# Patient Record
Sex: Female | Born: 1975 | Race: White | Hispanic: No | Marital: Married | Smoking: Never smoker
Health system: Southern US, Community
[De-identification: ages and names within clinical notes are randomized; demographics above are authoritative.]

## PROBLEM LIST (undated history)

## (undated) DIAGNOSIS — E039 Hypothyroidism, unspecified: Secondary | ICD-10-CM

---

## 2007-04-04 ENCOUNTER — Inpatient Hospital Stay (HOSPITAL_COMMUNITY): Admission: AD | Admit: 2007-04-04 | Discharge: 2007-04-06 | Payer: Self-pay | Admitting: *Deleted

## 2009-06-23 ENCOUNTER — Encounter: Admission: RE | Admit: 2009-06-23 | Discharge: 2009-06-23 | Payer: Self-pay | Admitting: Obstetrics

## 2010-02-16 ENCOUNTER — Encounter: Admission: RE | Admit: 2010-02-16 | Discharge: 2010-02-16 | Payer: Self-pay | Admitting: Endocrinology

## 2010-08-30 ENCOUNTER — Other Ambulatory Visit: Payer: Self-pay | Admitting: Endocrinology

## 2010-08-30 DIAGNOSIS — E041 Nontoxic single thyroid nodule: Secondary | ICD-10-CM

## 2011-01-13 LAB — CBC
HCT: 33.5 — ABNORMAL LOW
Hemoglobin: 11.7 — ABNORMAL LOW
MCHC: 34.5
MCHC: 35
MCV: 91
MCV: 92.9
Platelets: 234
Platelets: 275
RBC: 3.68 — ABNORMAL LOW
RDW: 13.4
RDW: 13.4
WBC: 12.2 — ABNORMAL HIGH

## 2011-01-13 LAB — RPR: RPR Ser Ql: NONREACTIVE

## 2011-02-20 ENCOUNTER — Ambulatory Visit
Admission: RE | Admit: 2011-02-20 | Discharge: 2011-02-20 | Disposition: A | Discharge status: Home / Self Care | Payer: BC Managed Care – PPO | Source: Ambulatory Visit | Attending: Endocrinology | Admitting: Endocrinology

## 2011-02-20 DIAGNOSIS — E041 Nontoxic single thyroid nodule: Secondary | ICD-10-CM

## 2011-04-17 ENCOUNTER — Other Ambulatory Visit: Payer: Self-pay | Admitting: Endocrinology

## 2011-04-17 DIAGNOSIS — E041 Nontoxic single thyroid nodule: Secondary | ICD-10-CM

## 2011-06-12 ENCOUNTER — Other Ambulatory Visit: Payer: BC Managed Care – PPO

## 2013-06-25 ENCOUNTER — Other Ambulatory Visit: Payer: Self-pay | Admitting: Endocrinology

## 2013-06-25 DIAGNOSIS — E041 Nontoxic single thyroid nodule: Secondary | ICD-10-CM

## 2013-07-01 ENCOUNTER — Ambulatory Visit
Admission: RE | Admit: 2013-07-01 | Discharge: 2013-07-01 | Disposition: A | Discharge status: Home / Self Care | Payer: BC Managed Care – PPO | Source: Ambulatory Visit | Attending: Endocrinology | Admitting: Endocrinology

## 2013-07-01 ENCOUNTER — Other Ambulatory Visit (HOSPITAL_COMMUNITY)
Admission: RE | Admit: 2013-07-01 | Discharge: 2013-07-01 | Disposition: A | Discharge status: Home / Self Care | Payer: BC Managed Care – PPO | Source: Ambulatory Visit | Attending: Interventional Radiology | Admitting: Interventional Radiology

## 2013-07-01 DIAGNOSIS — E041 Nontoxic single thyroid nodule: Secondary | ICD-10-CM

## 2013-11-07 ENCOUNTER — Encounter (HOSPITAL_BASED_OUTPATIENT_CLINIC_OR_DEPARTMENT_OTHER): Payer: Self-pay | Admitting: Emergency Medicine

## 2013-11-07 ENCOUNTER — Emergency Department (HOSPITAL_BASED_OUTPATIENT_CLINIC_OR_DEPARTMENT_OTHER)
Admission: EM | Admit: 2013-11-07 | Discharge: 2013-11-07 | Disposition: A | Discharge status: Home / Self Care | Payer: BC Managed Care – PPO | Attending: Emergency Medicine | Admitting: Emergency Medicine

## 2013-11-07 DIAGNOSIS — Z792 Long term (current) use of antibiotics: Secondary | ICD-10-CM | POA: Insufficient documentation

## 2013-11-07 DIAGNOSIS — H16009 Unspecified corneal ulcer, unspecified eye: Secondary | ICD-10-CM | POA: Insufficient documentation

## 2013-11-07 DIAGNOSIS — Z79899 Other long term (current) drug therapy: Secondary | ICD-10-CM | POA: Insufficient documentation

## 2013-11-07 DIAGNOSIS — H5710 Ocular pain, unspecified eye: Secondary | ICD-10-CM | POA: Insufficient documentation

## 2013-11-07 DIAGNOSIS — E039 Hypothyroidism, unspecified: Secondary | ICD-10-CM | POA: Insufficient documentation

## 2013-11-07 DIAGNOSIS — H16001 Unspecified corneal ulcer, right eye: Secondary | ICD-10-CM

## 2013-11-07 HISTORY — DX: Hypothyroidism, unspecified: E03.9

## 2013-11-07 MED ORDER — FLUORESCEIN SODIUM 1 MG OP STRP
ORAL_STRIP | OPHTHALMIC | Status: AC
  Administered 2013-11-07: 1
  Filled 2013-11-07: qty 1

## 2013-11-07 MED ORDER — ERYTHROMYCIN 5 MG/GM OP OINT: TOPICAL_OINTMENT | OPHTHALMIC | Status: AC

## 2013-11-07 MED ORDER — TETRACAINE HCL 0.5 % OP SOLN
OPHTHALMIC | Status: AC
  Administered 2013-11-07: 1 [drp]
  Filled 2013-11-07: qty 2

## 2013-11-07 NOTE — ED Provider Notes (Signed)
CSN: [id number]     Arrival date & time 11/07/13  2038 History   First MD Initiated Contact with Patient 11/07/13 2050     Chief Complaint  Patient presents with  . Eye Pain     (Consider location/radiation/quality/duration/timing/severity/associated sxs/prior Treatment) HPI Comments: Pt is a 38 y/o female who presents to the ED complaining of sudden onset right eye pain and redness beginning when she was driving earlier today. Pain described as sharp and shooting, constant. She has not tried any alleviating factors. Nothing in specific makes the pain worse. Denies vision changes. She was wearing contacts when the pain initially began, she took them out and put on her glasses. She uses 30 day contacts, she has had these in for only a few days. Denies any eye discharge. No recent rashes.  Patient is a 38 y.o. female presenting with eye pain. The history is provided by the patient.  Eye Pain    Past Medical History  Diagnosis Date  . Hypothyroidism    History reviewed. No pertinent past surgical history. No family history on file. History  Substance Use Topics  . Smoking status: Never Smoker   . Smokeless tobacco: Not on file  . Alcohol Use: Yes     Comment: occasionally   OB History   Grav Para Term Preterm Abortions TAB SAB Ect Mult Living                 Review of Systems  Eyes: Positive for pain and redness.  All other systems reviewed and are negative.     Allergies  Review of patient's allergies indicates no known allergies.  Home Medications   Prior to Admission medications   Medication Sig Start Date End Date Taking? Authorizing Provider  antiseptic oral rinse (BIOTENE) LIQD 15 mLs by Mouth Rinse route as needed for dry mouth.   Yes Historical Provider, MD  levothyroxine (SYNTHROID, LEVOTHROID) 50 MCG tablet Take 50 mcg by mouth daily before breakfast.   Yes Historical Provider, MD  Prenatal Vit-Fe Fumarate-FA (PRENATAL MULTIVITAMIN) TABS tablet Take 1 tablet  by mouth daily at 12 noon.   Yes Historical Provider, MD  Vitamin D, Ergocalciferol, (DRISDOL) 50000 UNITS CAPS capsule Take 50,000 Units by mouth every 7 (seven) days.   Yes Historical Provider, MD  erythromycin ophthalmic ointment Place a 1/2 inch ribbon of ointment into the lower eyelid every 6 hours x 5 days 11/07/13   Trevor Mace, PA-C   BP 114/69  Pulse 90  Temp(Src) 98.3 F (36.8 C) (Oral)  Resp 16  Ht 5\' 2"  (1.575 m)  Wt 130 lb (58.968 kg)  BMI 23.77 kg/m2  SpO2 100% Physical Exam  Nursing note and vitals reviewed. Constitutional: She is oriented to person, place, and time. She appears well-developed and well-nourished. No distress.  HENT:  Head: Normocephalic and atraumatic.  Mouth/Throat: Oropharynx is clear and moist.  Eyes: EOM are normal. Pupils are equal, round, and reactive to light. Right conjunctiva is injected.  Slit lamp exam:      The right eye shows corneal ulcer.  No dendritic lesions.  Neck: Normal range of motion. Neck supple.  Cardiovascular: Normal rate, regular rhythm and normal heart sounds.   Pulmonary/Chest: Effort normal and breath sounds normal.  Musculoskeletal: Normal range of motion. She exhibits no edema.  Neurological: She is alert and oriented to person, place, and time.  Skin: Skin is warm and dry. She is not diaphoretic.  Psychiatric: She has a normal mood and affect.  Her behavior is normal.    ED Course  Procedures (including critical care time) Labs Review Labs Reviewed - No data to display  Imaging Review No results found.   EKG Interpretation None      MDM   Final diagnoses:  Corneal ulcer, right   Patient presenting with right thigh pain and redness. She is nontoxic appearing and in no apparent distress. Corneal ulcer visible on general exam, however Right eye stained with fluorescein and no dendritic lesions seen. No corneal abrasion. Visual acuity 20/30 bilateral eyes. Will treat with erythromycin eye ointment. Patient  does not want any pain medication at this time. She has an ophthalmologist who she will followup with on Monday. Stable for discharge. Return precautions given. Patient states understanding of treatment care plan and is agreeable.  Trevor MaceRobyn M Albert, PA-C 11/07/13 2148

## 2013-11-07 NOTE — ED Notes (Signed)
Pt reports shooting pain and redness to right eye - denies any mechanism of injury or drainage.

## 2013-11-07 NOTE — ED Provider Notes (Signed)
Medical screening examination/treatment/procedure(s) were performed by non-physician practitioner and as supervising physician I was immediately available for consultation/collaboration.   EKG Interpretation None       Ethelda ChickMartha K Linker, MD 11/07/13 2152

## 2013-11-07 NOTE — Discharge Instructions (Signed)
Apply topical antibiotic eye drops to your right eye every 6 hours for 5 days. Follow up with your ophthalmologist Monday. Do not use contacts until followup with your ophthalmologist.  Corneal Ulcer A corneal ulcer is an open sore on the cornea. The cornea is the clear covering at the front and center of the eye.  CAUSES  Most corneal ulcers are caused by infection, but there are other causes as well.  Bacterial infection. A bacterial infection can occur and cause a corneal ulcer if:  Contact lenses are worn too long (especially overnight) or are not properly cared for.  An eye injury occurs, allowing bacteria to infect the area of injury.  Viral infection. A viral infection can occur and cause a corneal ulcer if:  The eye becomes infected with a virus, such as the herpes simplex (cold sore) virus, chickenpox virus, or shingles virus.  Fungal infection. A fungal infection can occur and cause a corneal ulcer if:  An eye injury resulted from contact with a plant or plant material.  An anti-inflammatory eye drop is overused.  You have a weakened immune system.  Contact lenses are improperly cared for or become infected.  Foreign bodies in the eye, such as sand, glass, or small pieces of glass or metal.  Dry eyes.  Certain disorders that prevent eyelids from closing completely, such as Bell's palsy.  Contact lenses, especially extended-wear soft contact lenses. Contact lenses can:  Scratch the cornea's surface, allowing bacteria to enter the scratch.  Trap dirt underneath the contact lens, which can scratch the cornea.  Harbor bacteria and fungi, making it more likely for bacterial infections to occur.  Block oxygen from the cornea, making it more likely for infections to occur. SYMPTOMS   Eye pain that is often severe.  Blurry vision.  Light sensitivity.  Pus or thick discharge coming from your eye.  Eye redness.  Feeling like something is in your eye.  Watery or  itchy eye.  Burning or stinging feeling. Some ulcers that are very big may be seen as a white spot on the cornea. DIAGNOSIS  An eye exam will be performed. Your health care provider may use a special kind of microscope (slit lamp) to look at the cornea. Eye drops may be put into the eye to make the ulcer easier to see. If it is suspected that an infection caused the corneal ulcer, tissue samples or cultures from the eye may be taken. Numbing eye drops will be given before any samples or cultures are taken. The samples or cultures will be examined in the lab to check for bacteria, viruses, or fungi. TREATMENT  Treatment of the corneal ulcer depends on the cause. If your ulcer is severe, you may be given antibiotic eye drops up until your health care provider knows the test results. Other treatments can include:  Antibacterial, antiviral, or antifungal eye drops or ointment.  Removing the foreign body that caused the eye injury.  Artificial tears or a bandage contact lens if severe dry eyes caused the corneal ulcer.  Over-the-counter or prescription pain medicine.  Steroidal eye drops if the eye is inflamed and swollen.  Antibiotic medicines by mouth.  An injection of medicine under the thin membrane covering the eyeball (conjunctiva). This allows medicine to reach the ulcer in high doses.  Eye patching to reduce irritation from blinking and bright light. An eye patch may not be given if the ulcer was caused by a bacterial infection. If the corneal ulcer causes  a scar on the cornea that interferes with vision, hospitalization and surgery may be needed to replace the cornea (corneal transplant). HOME CARE INSTRUCTIONS   If prescribed, use your antibiotic pills, eye drops, or ointment as directed. Continue using them even if you start to feel better. You may have to apply eye drops as often as every few minutes to every hour, for days. It may be necessary to set your alarm clock every few  minutes to every hour during the night. This is absolutely necessary.  Only take over-the-counter or prescription medicines as directed by your health care provider.  Apply artificial tears as needed if you have dry eyes.  Do not touch or rub your eye, because this may increase the irritation and spread the infection.  Avoid wearing eye makeup.  Stay in a dark room and use sunglasses if you have light sensitivity.  Apply cool packs to your eye to relieve discomfort and swelling.  If your eye is patched, you should not drive or use machinery. You will have reduced side vision and ability to judge distance.  Do not drive or operate machinery until approved by your health care provider. Your ability to judge distances is impaired.  Follow up with your health care provider as directed.  Do not wear contact lenses until your health care provider approves. If you normally wear contact lenses, follow these general rules to avoid the risk of a corneal ulcer:  Do not wear contact lenses while you sleep.  Wash your hands before removing contact lenses.  Properly sterilize and store your contact lenses.  Regularly clean your contact lens case.  Do not use your saliva or tap water to clean or wet your contact lenses.  Remove your contact lenses if your eye becomes irritated. You may put them back in once your eyes feel better. SEEK IMMEDIATE MEDICAL CARE IF:   You notice a change in your vision.  Your pain is getting worse, not better.  You have increasing discharge from the eye. MAKE SURE YOU:   Understand these instructions.  Will watch your condition.  Will get help right away if you are not doing well or get worse. Document Released: 05/04/2004 Document Revised: 11/27/2012 Document Reviewed: 08/27/2012 [facility] Patient Information 2015 [employer] [place]. This information is not intended to replace advice given to you by your health care provider. Make sure you discuss any  questions you have with your health care provider.

## 2013-12-12 ENCOUNTER — Other Ambulatory Visit: Payer: Self-pay | Admitting: Endocrinology

## 2013-12-12 DIAGNOSIS — E049 Nontoxic goiter, unspecified: Secondary | ICD-10-CM

## 2014-12-04 ENCOUNTER — Other Ambulatory Visit: Payer: BC Managed Care – PPO

## 2014-12-15 ENCOUNTER — Other Ambulatory Visit: Payer: BC Managed Care – PPO

## 2015-10-20 ENCOUNTER — Other Ambulatory Visit: Payer: Self-pay | Admitting: Endocrinology

## 2015-10-20 DIAGNOSIS — E041 Nontoxic single thyroid nodule: Secondary | ICD-10-CM

## 2017-04-11 ENCOUNTER — Emergency Department (HOSPITAL_BASED_OUTPATIENT_CLINIC_OR_DEPARTMENT_OTHER)
Admission: EM | Admit: 2017-04-11 | Discharge: 2017-04-11 | Disposition: A | Discharge status: Home / Self Care | Payer: 59 | Attending: Emergency Medicine | Admitting: Emergency Medicine

## 2017-04-11 ENCOUNTER — Other Ambulatory Visit: Payer: Self-pay

## 2017-04-11 ENCOUNTER — Emergency Department (HOSPITAL_BASED_OUTPATIENT_CLINIC_OR_DEPARTMENT_OTHER): Payer: 59

## 2017-04-11 ENCOUNTER — Encounter (HOSPITAL_BASED_OUTPATIENT_CLINIC_OR_DEPARTMENT_OTHER): Payer: Self-pay | Admitting: Emergency Medicine

## 2017-04-11 DIAGNOSIS — E039 Hypothyroidism, unspecified: Secondary | ICD-10-CM | POA: Diagnosis not present

## 2017-04-11 DIAGNOSIS — Z79899 Other long term (current) drug therapy: Secondary | ICD-10-CM | POA: Diagnosis not present

## 2017-04-11 DIAGNOSIS — N83209 Unspecified ovarian cyst, unspecified side: Secondary | ICD-10-CM

## 2017-04-11 DIAGNOSIS — R1033 Periumbilical pain: Secondary | ICD-10-CM | POA: Diagnosis present

## 2017-04-11 LAB — CBC
HCT: 38.1 % (ref 36.0–46.0)
Hemoglobin: 13.5 g/dL (ref 12.0–15.0)
MCH: 30.7 pg (ref 26.0–34.0)
MCHC: 35.4 g/dL (ref 30.0–36.0)
MCV: 86.6 fL (ref 78.0–100.0)
Platelets: 342 10*3/uL (ref 150–400)
RBC: 4.4 MIL/uL (ref 3.87–5.11)
RDW: 11.9 % (ref 11.5–15.5)
WBC: 17.7 10*3/uL — ABNORMAL HIGH (ref 4.0–10.5)

## 2017-04-11 LAB — RAPID HIV SCREEN (HIV 1/2 AB+AG)
HIV 1/2 Antibodies: NONREACTIVE
HIV-1 P24 Antigen - HIV24: NONREACTIVE

## 2017-04-11 LAB — URINALYSIS, MICROSCOPIC (REFLEX)

## 2017-04-11 LAB — COMPREHENSIVE METABOLIC PANEL
ALT: 9 U/L — ABNORMAL LOW (ref 14–54)
AST: 15 U/L (ref 15–41)
Albumin: 4 g/dL (ref 3.5–5.0)
Alkaline Phosphatase: 65 U/L (ref 38–126)
Anion gap: 8 (ref 5–15)
BUN: 17 mg/dL (ref 6–20)
CO2: 24 mmol/L (ref 22–32)
Calcium: 8.9 mg/dL (ref 8.9–10.3)
Chloride: 103 mmol/L (ref 101–111)
Creatinine, Ser: 0.91 mg/dL (ref 0.44–1.00)
GFR calc Af Amer: >60 mL/min (ref >60)
GFR calc non Af Amer: >60 mL/min (ref >60)
Glucose, Bld: 111 mg/dL — ABNORMAL HIGH (ref 65–99)
Potassium: 3.7 mmol/L (ref 3.5–5.1)
Sodium: 135 mmol/L (ref 135–145)
Total Bilirubin: 0.7 mg/dL (ref 0.3–1.2)
Total Protein: 7.8 g/dL (ref 6.5–8.1)

## 2017-04-11 LAB — WET PREP, GENITAL
Clue Cells Wet Prep HPF POC: NONE SEEN
SPERM: NONE SEEN
TRICH WET PREP: NONE SEEN
YEAST WET PREP: NONE SEEN

## 2017-04-11 LAB — URINALYSIS, ROUTINE W REFLEX MICROSCOPIC
Bilirubin Urine: NEGATIVE
Glucose, UA: NEGATIVE mg/dL
Ketones, ur: 15 mg/dL — AB
Leukocytes, UA: NEGATIVE
Nitrite: NEGATIVE
Protein, ur: NEGATIVE mg/dL
Specific Gravity, Urine: 1.02 (ref 1.005–1.030)
pH: 6.5 (ref 5.0–8.0)

## 2017-04-11 LAB — PREGNANCY, URINE: Preg Test, Ur: NEGATIVE

## 2017-04-11 LAB — LIPASE, BLOOD: Lipase: 30 U/L (ref 11–51)

## 2017-04-11 MED ORDER — NAPROXEN 500 MG PO TABS: 500.0000 mg | ORAL_TABLET | Freq: Two times a day (BID) | ORAL | 0 refills | Status: AC

## 2017-04-11 MED ORDER — IOPAMIDOL (ISOVUE-300) INJECTION 61%
100.0000 mL | Freq: Once | INTRAVENOUS | Status: AC | PRN
  Administered 2017-04-11: 100 mL via INTRAVENOUS

## 2017-04-11 MED ORDER — MORPHINE SULFATE (PF) 4 MG/ML IV SOLN
4.0000 mg | Freq: Once | INTRAVENOUS | Status: DC
  Filled 2017-04-11: qty 1

## 2017-04-11 MED ORDER — ONDANSETRON HCL 4 MG/2ML IJ SOLN
4.0000 mg | Freq: Once | INTRAMUSCULAR | Status: DC
  Filled 2017-04-11: qty 2

## 2017-04-11 MED ORDER — SODIUM CHLORIDE 0.9 % IV BOLUS (SEPSIS)
1000.0000 mL | Freq: Once | INTRAVENOUS | Status: AC
Start: 2017-04-11 — End: 2017-04-11
  Administered 2017-04-11: 1000 mL via INTRAVENOUS

## 2017-04-11 NOTE — ED Notes (Signed)
Called CT and she is on the list

## 2017-04-11 NOTE — ED Notes (Signed)
Attempted twice to start IV unsuccessful

## 2017-04-11 NOTE — Discharge Instructions (Signed)
Your pain is likely due a ruptured ovarian cyst.  Take naproxen as needed for pain.  Followup with your doctor for further care.

## 2017-04-11 NOTE — ED Notes (Signed)
Pt understood dc material. NAD noted. Script given at dc  

## 2017-04-11 NOTE — ED Triage Notes (Addendum)
Patient reports mid lower abdominal pain which began at 0300 this morning.  Reports nausea and 1 episode of vomiting.  Currently on menstrual cycle and reports last BM 2 days ago.  States this is normal for her. Denies dysuria.

## 2017-04-11 NOTE — ED Provider Notes (Signed)
[facility] EMERGENCY DEPARTMENT Provider Note   CSN: [id number] Arrival date & time: 04/11/17  0744     History   Chief Complaint Chief Complaint  Patient presents with  . Abdominal Pain    HPI Shelly Austin is a 42 y.o. female.  HPI   42 year old female presenting for evaluation of abdominal pain.  Patient reports she was awoke at 3 AM this morning with acute onset of periumbilical abdominal pain.  Pain is described as a sharp sensation that radiates to her right lower.  The pain was severe, with associated nausea, vomiting, diaphoresis.  Pain lasted for several hours and has since subsided.  Pain is currently 3 out of 10, worsened with movement.  She felt fine yesterday.  She does endorse chills earlier.  She is currently on her menstrual period.  No prior abdominal surgery.  She does feel mildly constipated.  No report of chest pain, shortness of breath, productive cough, dysuria, vaginal discharge.  No specific treatment tried.  Past Medical History:  Diagnosis Date  . Hypothyroidism     There are no active problems to display for this patient.   History reviewed. No pertinent surgical history.  OB History    No data available       Home Medications    Prior to Admission medications   Medication Sig Start Date End Date Taking? Authorizing Provider  antiseptic oral rinse (BIOTENE) LIQD 15 mLs by Mouth Rinse route as needed for dry mouth.    [provider]  erythromycin ophthalmic ointment Place a 1/2 inch ribbon of ointment into the lower eyelid every 6 hours x 5 days 11/07/13   Hess, Nada Boozer, PA-C  levothyroxine (SYNTHROID, LEVOTHROID) 50 MCG tablet Take 50 mcg by mouth daily before breakfast.    [provider]  Prenatal Vit-Fe Fumarate-FA (PRENATAL MULTIVITAMIN) TABS tablet Take 1 tablet by mouth daily at 12 noon.    [provider]  Vitamin D, Ergocalciferol, (DRISDOL) 50000 UNITS CAPS capsule Take 50,000 Units by mouth  every 7 (seven) days.    [provider]    Family History History reviewed. No pertinent family history.  Social History Social History   Tobacco Use  . Smoking status: Never Smoker  . Smokeless tobacco: Never Used  Substance Use Topics  . Alcohol use: Yes    Comment: occasionally  . Drug use: No     Allergies   Patient has no known allergies.   Review of Systems Review of Systems  All other systems reviewed and are negative.    Physical Exam Updated Vital Signs BP 124/73 (BP Location: Right Arm)   Pulse (!) 117   Temp 99.4 F (37.4 C) (Oral)   Resp 18   Ht 5\' 2"  (1.575 m)   Wt 72.6 kg (160 lb)   LMP 04/09/2017 (Exact Date)   SpO2 100%   BMI 29.26 kg/m    Physical Exam  Constitutional: She appears well-developed and well-nourished. No distress.  HENT:  Head: Atraumatic.  Eyes: Conjunctivae are normal.  Neck: Neck supple.  Cardiovascular: Regular rhythm.  Tachycardia without murmur rubs or gallops  Pulmonary/Chest: Effort normal and breath sounds normal.  Abdominal: Normal appearance and bowel sounds are normal. She exhibits no pulsatile midline mass. There is tenderness in the right lower quadrant and periumbilical area. There is no tenderness at McBurney's point and negative Murphy's sign. Hernia confirmed negative in the right inguinal area and confirmed negative in the left inguinal area.  Genitourinary:  Genitourinary Comments: Chaperone present during exam.  No inguinal lymphopathy or inguinal hernia noted.  Normal external genitalia.  No discomfort with speculum insertion.  Mild amount of blood noted in vaginal vault.  Cervical osseous closed with normal appearance.  On bimanual examination, no adnexal tenderness or cervical motion tenderness.  No abnormal mass.  Neurological: She is alert.  Skin: No rash noted.  Psychiatric: She has a normal mood and affect.  Nursing note and vitals reviewed.    ED Treatments / Results  Labs (all labs  ordered are listed, but only abnormal results are displayed) Labs Reviewed  WET PREP, GENITAL - Abnormal; Notable for the following components:      Result Value   WBC, Wet Prep HPF POC MANY (*)    All other components within normal limits  COMPREHENSIVE METABOLIC PANEL - Abnormal; Notable for the following components:   Glucose, Bld 111 (*)    ALT 9 (*)    All other components within normal limits  CBC - Abnormal; Notable for the following components:   WBC 17.7 (*)    All other components within normal limits  URINALYSIS, ROUTINE W REFLEX MICROSCOPIC - Abnormal; Notable for the following components:   Hgb urine dipstick LARGE (*)    Ketones, ur 15 (*)    All other components within normal limits  URINALYSIS, MICROSCOPIC (REFLEX) - Abnormal; Notable for the following components:   Bacteria, UA MANY (*)    Squamous Epithelial / LPF 0-5 (*)    All other components within normal limits  LIPASE, BLOOD  PREGNANCY, URINE  RAPID HIV SCREEN (HIV 1/2 AB+AG)  RPR  GC/CHLAMYDIA PROBE AMP ([place]) NOT AT [facility]    EKG  EKG Interpretation None       Radiology Ct Abdomen Pelvis W Contrast  Result Date: 04/11/2017 CLINICAL DATA:  Mid abdominal pain with nausea starting early this morning. Possible abdominal infection. No primary malignancy history. EXAM: CT ABDOMEN AND PELVIS WITH CONTRAST TECHNIQUE: Multidetector CT imaging of the abdomen and pelvis was performed using the standard protocol following bolus administration of intravenous contrast. CONTRAST:  ISOVUE-300 IOPAMIDOL (ISOVUE-300) INJECTION 61% COMPARISON:  None. FINDINGS: Lower chest: Clear lung bases. Normal heart size without pericardial or pleural effusion. Hepatobiliary: Lateral segment left liver lobe lesion measures 1.3 cm and greater than fluid density on image 7/series 2. A medial segment left liver lobe 1.5 cm lesion is hypoattenuating but has a suggestion of peripheral nodular enhancement, including on image  15/series 2. Normal gallbladder, without biliary ductal dilatation. Pancreas: Normal, without mass or ductal dilatation. Spleen: Normal in size, without focal abnormality. Adrenals/Urinary Tract: Normal adrenal glands. 6 mm upper pole right renal collecting system calculus. Normal left kidney, without hydronephrosis or hydroureter. Normal urinary bladder. Stomach/Bowel: Normal stomach, without wall thickening. Normal colon, appendix, and terminal ileum. Normal small bowel. Vascular/Lymphatic: Normal caliber of the aorta and branch vessels. No abdominopelvic adenopathy. Reproductive: Retroverted uterus.  No adnexal mass. Other: Minimally complex small volume pelvic fluid is eccentric left, including on image 62/series 2. Musculoskeletal: Transitional lumbosacral anatomy. IMPRESSION: 1.  No acute process in the abdomen or pelvis. 2. Right nephrolithiasis. 3. Minimally complex fluid within the pelvis, slightly greater than typically seen physiologically. Question recent cyst or follicle rupture. 4. 1.3 and 1.5 cm indeterminate liver lesions. Per consensus criteria, the should be considered for nonemergent outpatient pre and post contrast abdominal MRI. This recommendation follows ACR consensus guidelines: Management of Incidental Liver Lesions on CT: A White  Paper of the ACR Incidental Findings Committee. J Am Coll Radiol 2017; 45:4098-1191; 14:1429-1437. Electronically Signed   By: Jeronimo GreavesKyle  Talbot M.D.   On: 04/11/2017 11:45    Procedures Procedures (including critical care time)  Medications Ordered in ED Medications  morphine 4 MG/ML injection 4 mg (0 mg Intravenous Hold 04/11/17 0959)  ondansetron (ZOFRAN) injection 4 mg (0 mg Intravenous Hold 04/11/17 0959)  sodium chloride 0.9 % bolus 1,000 mL (0 mLs Intravenous Stopped 04/11/17 1137)  iopamidol (ISOVUE-300) 61 % injection 100 mL (100 mLs Intravenous Contrast Given 04/11/17 1121)     Initial Impression / Assessment and Plan / ED Course  I have reviewed the triage vital  signs and the nursing notes.  Pertinent labs & imaging results that were available during my care of the patient were reviewed by me and considered in my medical decision making (see chart for details).     BP 124/73 (BP Location: Right Arm)   Pulse (!) 117   Temp 99.4 F (37.4 C) (Oral)   Resp 18   Ht 5\' 2"  (1.575 m)   Wt 72.6 kg (160 lb)   LMP 04/09/2017 (Exact Date)   SpO2 100%   BMI 29.26 kg/m     Final Clinical Impressions(s) / ED Diagnoses   Final diagnoses:  Ruptured ovarian cyst    ED Discharge Orders        Ordered    naproxen (NAPROSYN) 500 MG tablet  2 times daily     04/11/17 1233      9:14 AM Acute onset of periumbilical now right lower quadrant tenderness at this a.m.  Mild discomfort on palpation without any peritoneal sign.  She is currently on her menstruation.  Workup initiated.  9:56 AM Patient has an elevated white count of 17.7.  Given the location of her pain, I will obtain an abdominal pelvic CT scan to rule out acute abdominal pathology.  Urinalysis shows large amounts of hemoglobin and urine dipsticks however patient is currently on her menstruation and I suspect the blood is coming from her period.  She has no CVA tenderness to suggest kidney stone.  Her pregnancy test is negative.  12:14 PM Wet prep shows many WBC however patient does not have any signs of PID on exam and she is not concerned for STD.  Will send cultures and will treat as appropriate.  No adnexal tenderness to suggest ovarian torsion.  Her pregnancy test is negative. Abdominal and pelvis CT scan without acute process.  There is some complex fluid within the pelvis questionable for a recent rupture of ovarian cyst.  I suspect this is what caused patient's pain.  I have low suspicion for ovarian torsion or appendicitis.  Also incidental finding of liver lesion that I spoke with patient and recommend outpatient follow-up as necessary.  Patient discharged home with NSAIDs.  Return  precautions discussed.  Fayrene Helperran, Silvana Holecek, PA-C 04/11/17 1234  Raeford RazorKohut, Stephen, MD 04/11/17 [phone]

## 2017-04-12 LAB — RPR: RPR Ser Ql: NONREACTIVE

## 2017-04-12 LAB — GC/CHLAMYDIA PROBE AMP (~~LOC~~) NOT AT ARMC
Chlamydia: NEGATIVE
Neisseria Gonorrhea: NEGATIVE

## 2017-06-05 ENCOUNTER — Other Ambulatory Visit: Payer: Self-pay | Admitting: Gastroenterology

## 2017-06-05 DIAGNOSIS — K769 Liver disease, unspecified: Secondary | ICD-10-CM

## 2017-06-20 ENCOUNTER — Ambulatory Visit
Admission: RE | Admit: 2017-06-20 | Discharge: 2017-06-20 | Disposition: A | Discharge status: Home / Self Care | Payer: 59 | Source: Ambulatory Visit | Attending: Gastroenterology | Admitting: Gastroenterology

## 2017-06-20 DIAGNOSIS — K769 Liver disease, unspecified: Secondary | ICD-10-CM

## 2017-06-20 MED ORDER — GADOBENATE DIMEGLUMINE 529 MG/ML IV SOLN
15.0000 mL | Freq: Once | INTRAVENOUS | Status: AC | PRN
  Administered 2017-06-20: 15 mL via INTRAVENOUS

## 2017-12-17 ENCOUNTER — Other Ambulatory Visit: Payer: Self-pay | Admitting: Gastroenterology

## 2017-12-17 DIAGNOSIS — R933 Abnormal findings on diagnostic imaging of other parts of digestive tract: Secondary | ICD-10-CM

## 2018-03-25 ENCOUNTER — Other Ambulatory Visit: Payer: Self-pay | Admitting: Endocrinology

## 2018-03-25 DIAGNOSIS — E041 Nontoxic single thyroid nodule: Secondary | ICD-10-CM

## 2019-03-31 ENCOUNTER — Other Ambulatory Visit: Payer: Self-pay | Admitting: Obstetrics

## 2020-04-15 ENCOUNTER — Other Ambulatory Visit: Payer: Self-pay | Admitting: Endocrinology

## 2020-04-15 DIAGNOSIS — E041 Nontoxic single thyroid nodule: Secondary | ICD-10-CM

## 2020-06-01 ENCOUNTER — Ambulatory Visit
Admission: RE | Admit: 2020-06-01 | Discharge: 2020-06-01 | Disposition: A | Discharge status: Home / Self Care | Payer: No Typology Code available for payment source | Source: Ambulatory Visit | Attending: Endocrinology | Admitting: Endocrinology

## 2020-06-01 DIAGNOSIS — E041 Nontoxic single thyroid nodule: Secondary | ICD-10-CM

## 2021-08-09 IMAGING — US US THYROID
1 series · 13 of 25 positions shown · non-contrast
Comparison: 04/18/2018

CLINICAL DATA: Prior ultrasound follow-up.

EXAM:
THYROID ULTRASOUND
TECHNIQUE: Ultrasound examination of the thyroid gland and adjacent soft
tissues was performed.

[Series 1: us thyroid · 0.04mm/px · 13 of 51 slices shown]
[im 1/51]
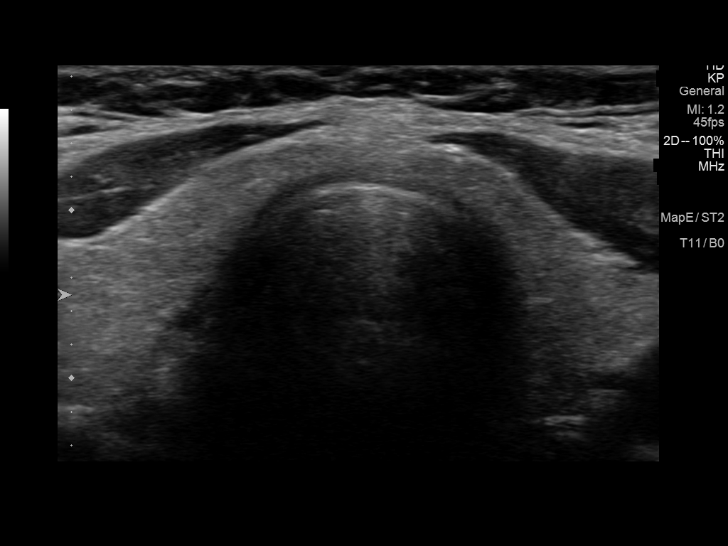
[im 5/51]
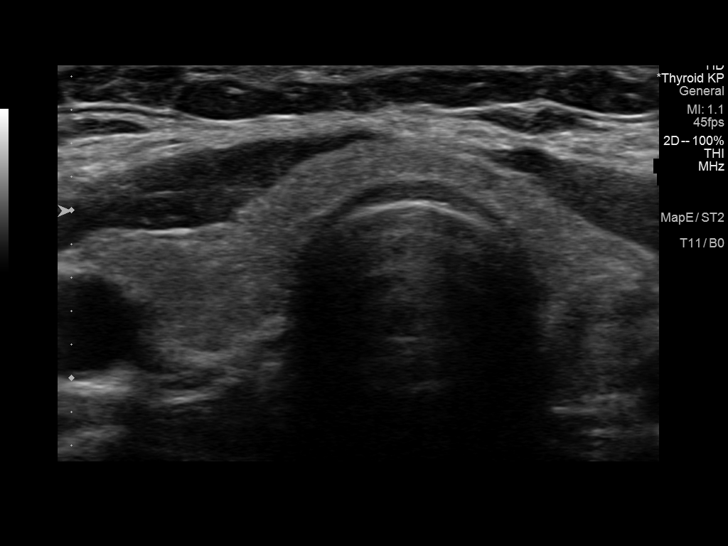
[im 9/51]
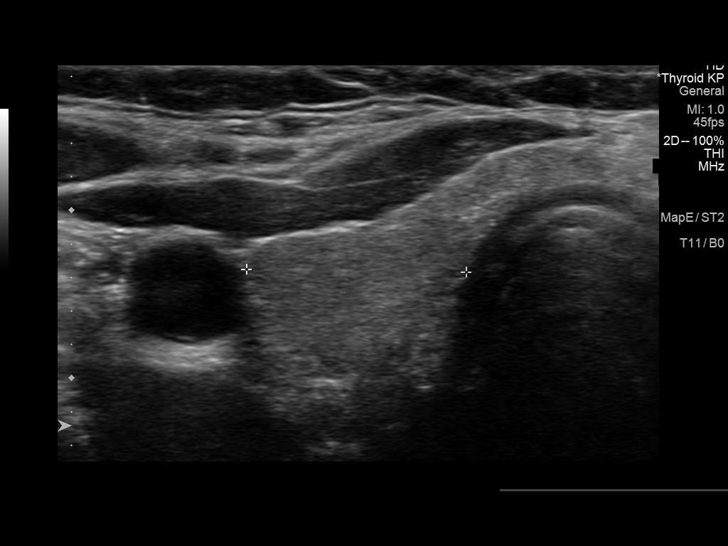
[im 13/51]
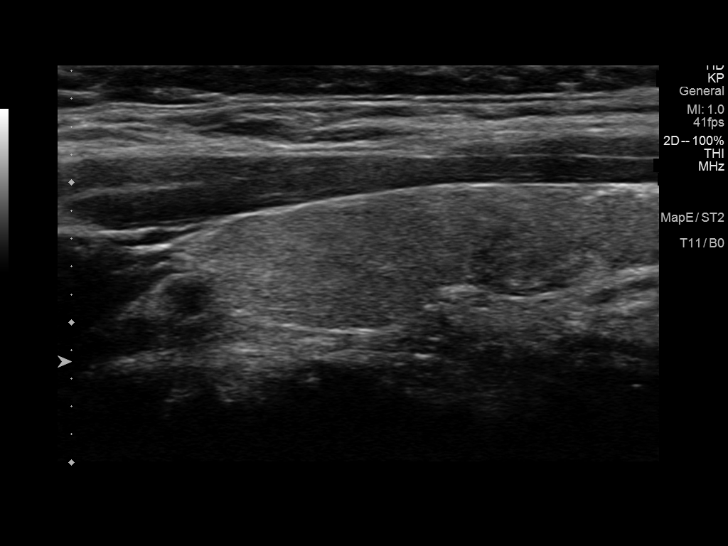
[im 17/51]
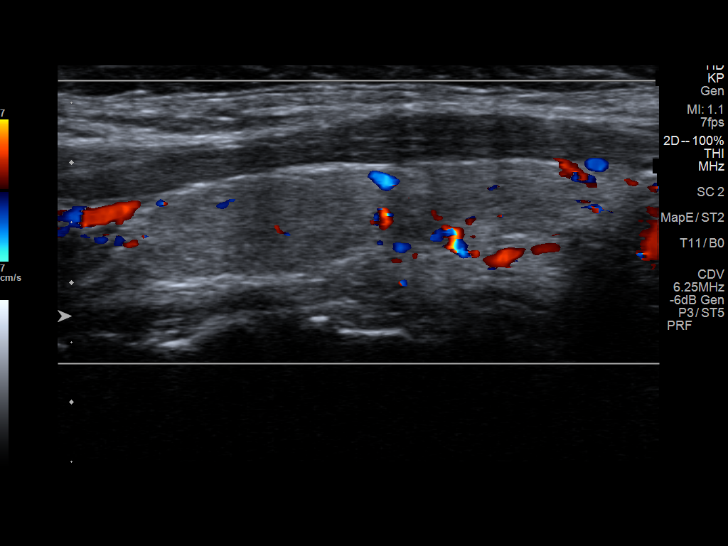
[im 21/51]
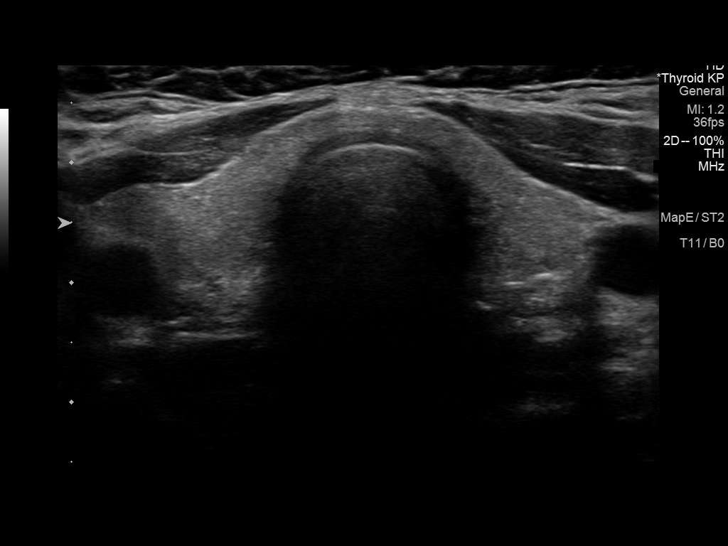
[im 26/51]
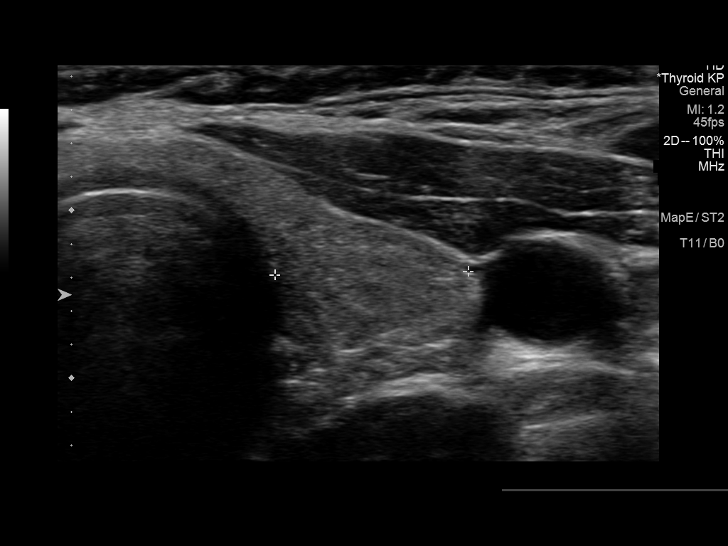
[im 30/51]
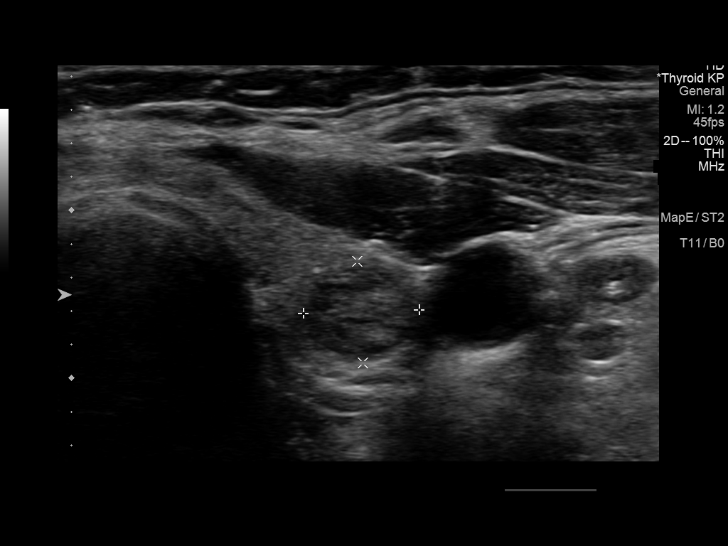
[im 34/51]
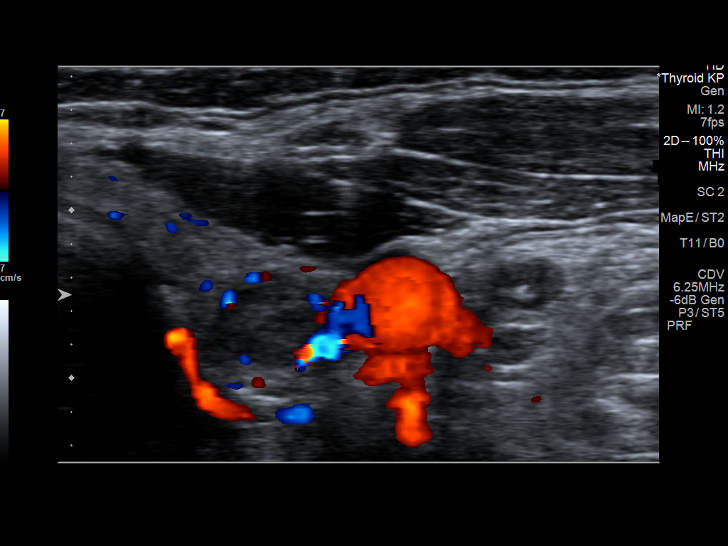
[im 38/51]
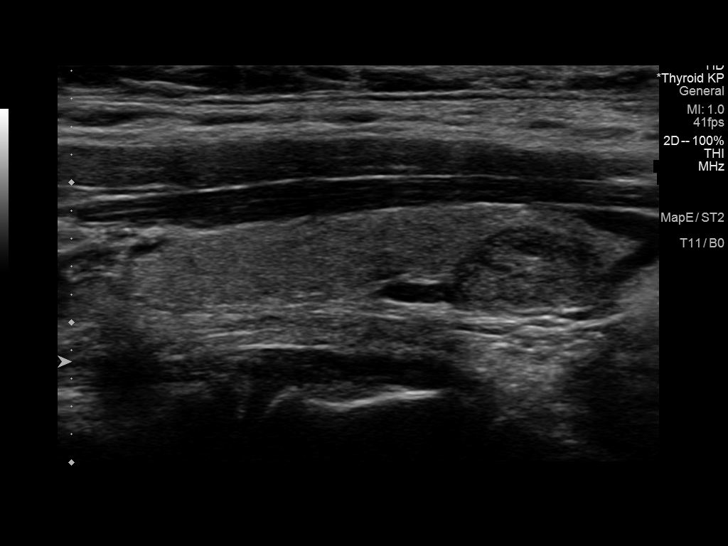
[im 42/51]
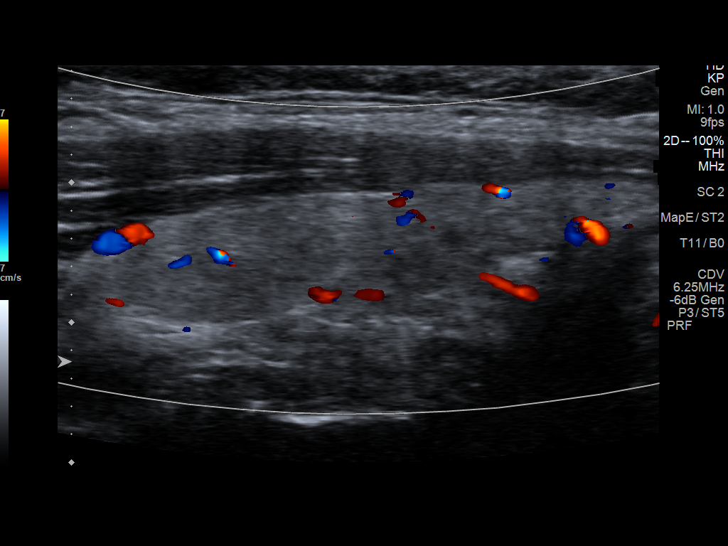
[im 46/51]
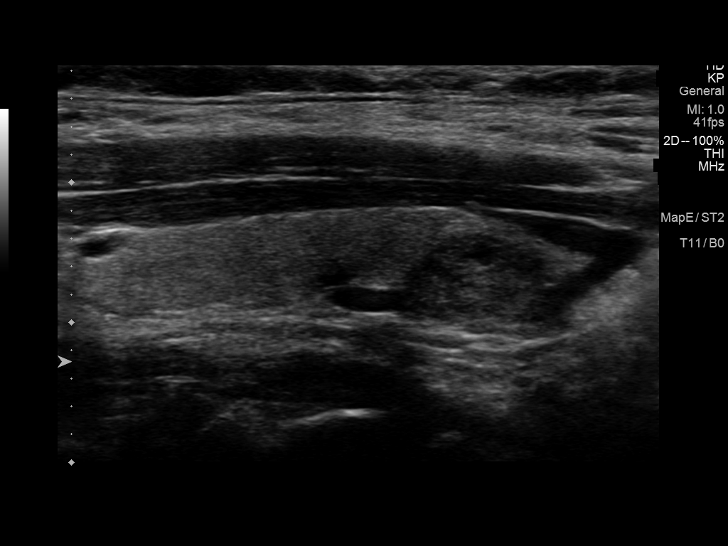
[im 51/51]
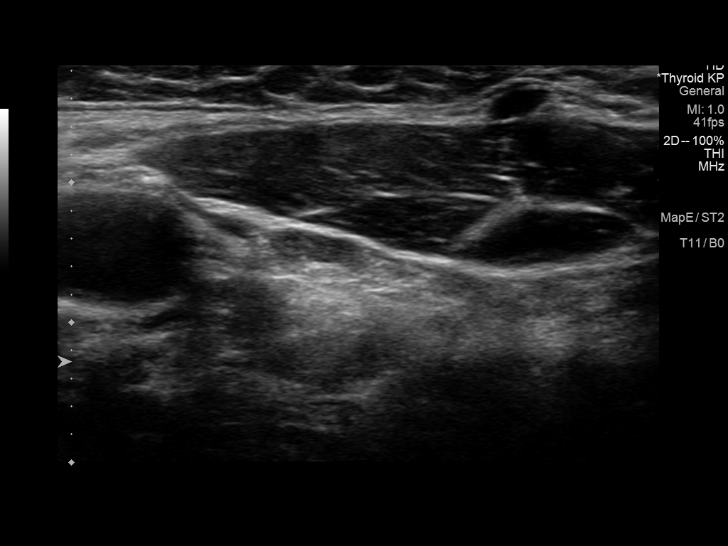

[13 of 25 positions shown; findings below may reference images not displayed]

FINDINGS: Parenchymal Echotexture: Mildly heterogenous

Isthmus: 0.3 cm, previously 0.4 cm

Right lobe: 11 x 0.8 x 1.3 cm, previously 3.811113 cm

Left lobe: 4.0 x 1.0 x 1.2 cm, previously 3.8 x 1.1 x 1.1 cm

_________________________________________________________

Estimated total number of nodules >/= 1 cm: 1

Number of spongiform nodules >/=  2 cm not described below (TR1): 0

Number of mixed cystic and solid nodules >/= 1.5 cm not described
below (TR2): 0

_________________________________________________________

Nodule # 1:

Prior biopsy: Yes

Location: Left; Inferior

Maximum size: 1.1 cm; Other 2 dimensions: 0.7 x 0.6 cm, previously,
1.2 x 0.8 x 0.7 cm

Composition: solid/almost completely solid (2)

Echogenicity: hypoechoic (2)

Shape: not taller-than-wide (0)

Margins: ill-defined (0)

Echogenic foci: none (0)

ACR TI-RADS total points: 4.

ACR TI-RADS risk category:  TR4 (4-6 points).

Significant change in size (>/= 20% in two dimensions and minimal
increase of 2 mm): No

Change in features: No

Change in ACR TI-RADS risk category: No

ACR TI-RADS recommendations:

No further follow-up, 5 years stability is established.

_________________________________________________________
IMPRESSION: Unchanged left inferior thyroid nodule, previously biopsied in 1875.
This nodule is unchanged since 9653, now declared benign, and
therefore no further ultrasound follow-up or tissue sampling is
warranted

The above is in keeping with the ACR TI-RADS recommendations - [HOSPITAL] 9653;[DATE].
